# Patient Record
Sex: Male | Born: 1985 | Race: Black or African American | Hispanic: No | Marital: Single | Smoking: Current every day smoker
Health system: Southern US, Community
[De-identification: ages and names within clinical notes are randomized; demographics above are authoritative.]

---
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
## 2009-03-20 ENCOUNTER — Emergency Department (HOSPITAL_COMMUNITY): Admission: EM | Admit: 2009-03-20 | Discharge: 2009-03-21 | Payer: Self-pay | Admitting: Emergency Medicine

## 2009-03-26 ENCOUNTER — Emergency Department (HOSPITAL_COMMUNITY): Admission: EM | Admit: 2009-03-26 | Discharge: 2009-03-26 | Payer: Self-pay | Admitting: Emergency Medicine

## 2009-05-13 ENCOUNTER — Emergency Department (HOSPITAL_COMMUNITY): Admission: EM | Admit: 2009-05-13 | Discharge: 2009-05-13 | Payer: Self-pay | Admitting: Emergency Medicine

## 2010-07-15 ENCOUNTER — Emergency Department (HOSPITAL_COMMUNITY)
Admission: EM | Admit: 2010-07-15 | Discharge: 2010-07-15 | Disposition: A | Discharge status: Home / Self Care | Payer: Self-pay | Attending: Emergency Medicine | Admitting: Emergency Medicine

## 2010-07-15 DIAGNOSIS — H6690 Otitis media, unspecified, unspecified ear: Secondary | ICD-10-CM | POA: Insufficient documentation

## 2010-07-15 DIAGNOSIS — H9209 Otalgia, unspecified ear: Secondary | ICD-10-CM | POA: Insufficient documentation

## 2014-04-14 ENCOUNTER — Emergency Department (HOSPITAL_COMMUNITY)
Admission: EM | Admit: 2014-04-14 | Discharge: 2014-04-14 | Disposition: A | Discharge status: Home / Self Care | Payer: Self-pay | Attending: Emergency Medicine | Admitting: Emergency Medicine

## 2014-04-14 ENCOUNTER — Encounter (HOSPITAL_COMMUNITY): Payer: Self-pay | Admitting: Emergency Medicine

## 2014-04-14 DIAGNOSIS — Z23 Encounter for immunization: Secondary | ICD-10-CM | POA: Insufficient documentation

## 2014-04-14 DIAGNOSIS — Y9289 Other specified places as the place of occurrence of the external cause: Secondary | ICD-10-CM | POA: Insufficient documentation

## 2014-04-14 DIAGNOSIS — Y9389 Activity, other specified: Secondary | ICD-10-CM | POA: Insufficient documentation

## 2014-04-14 DIAGNOSIS — X58XXXA Exposure to other specified factors, initial encounter: Secondary | ICD-10-CM | POA: Insufficient documentation

## 2014-04-14 DIAGNOSIS — Y998 Other external cause status: Secondary | ICD-10-CM | POA: Insufficient documentation

## 2014-04-14 DIAGNOSIS — H1132 Conjunctival hemorrhage, left eye: Secondary | ICD-10-CM | POA: Insufficient documentation

## 2014-04-14 MED ORDER — TETANUS-DIPHTH-ACELL PERTUSSIS 5-2.5-18.5 LF-MCG/0.5 IM SUSP
0.5000 mL | Freq: Once | INTRAMUSCULAR | Status: AC
Start: 2014-04-14 — End: 2014-04-14
  Administered 2014-04-14: 0.5 mL via INTRAMUSCULAR
  Filled 2014-04-14: qty 0.5

## 2014-04-14 MED ORDER — FLUORESCEIN SODIUM 1 MG OP STRP
1.0000 | ORAL_STRIP | Freq: Once | OPHTHALMIC | Status: AC
  Administered 2014-04-14: 1 via OPHTHALMIC
  Filled 2014-04-14: qty 1

## 2014-04-14 MED ORDER — OXYCODONE-ACETAMINOPHEN 5-325 MG PO TABS
1.0000 | ORAL_TABLET | Freq: Once | ORAL | Status: AC
  Administered 2014-04-14: 1 via ORAL
  Filled 2014-04-14: qty 1

## 2014-04-14 MED ORDER — ERYTHROMYCIN 5 MG/GM OP OINT
TOPICAL_OINTMENT | Freq: Four times a day (QID) | OPHTHALMIC | Status: DC
  Administered 2014-04-14: 18:00:00 via OPHTHALMIC
  Filled 2014-04-14: qty 3.5

## 2014-04-14 MED ORDER — TETRACAINE HCL 0.5 % OP SOLN
2.0000 [drp] | Freq: Once | OPHTHALMIC | Status: AC
  Administered 2014-04-14: 2 [drp] via OPHTHALMIC
  Filled 2014-04-14: qty 2

## 2014-04-14 NOTE — ED Provider Notes (Signed)
CSN: [id number]     Arrival date & time 04/14/14  1440 History  This chart was scribed for non-physician practitioner, Joel Mow, PA-C working with Joel Sheffield, MD by Joel Boyd, ED scribe. This patient was seen in room Joel Boyd/Joel Boyd and the patient's care was started at 3:41 PM.    Chief Complaint  Patient presents with  . Eye Injury   The history is provided by the patient. No language interpreter was used.    HPI Comments: Joel Boyd is a 29 y.o. male who presents to the Emergency Department complaining of left eye injury that occurred prior to arrival. States his wife accidentally scratched his eye with her fingernail. Reports sudden onset pain with associated redness and irritation.  Patient denies eye pain, photophobia and blurred vision. His last tetanus was 5 years ago.   History reviewed. No pertinent past medical history. History reviewed. No pertinent past surgical history. History reviewed. No pertinent family history. History  Substance Use Topics  . Smoking status: Never Smoker   . Smokeless tobacco: Not on file  . Alcohol Use: No    Review of Systems  Eyes: Positive for photophobia, pain, redness and visual disturbance.  All other systems reviewed and are negative.  Allergies  Review of patient's allergies indicates no known allergies.  Home Medications   Prior to Admission medications   Not on File   BP 128/86 mmHg  Pulse 55  Temp(Src) 97.8 F (36.6 C) (Oral)  Resp 19  SpO2 100%   Physical Exam  Constitutional: He is oriented to person, place, and time. He appears well-developed and well-nourished. No distress.  HENT:  Head: Normocephalic and atraumatic.  Eyes: EOM and lids are normal. Pupils are equal, round, and reactive to light. Lids are everted and swept, no foreign bodies found. Right eye exhibits no chemosis and no discharge. Left eye exhibits no chemosis, no discharge and no exudate. No foreign body present in the left eye. Right  conjunctiva is not injected. Left conjunctiva is injected. No scleral icterus. Right eye exhibits normal extraocular motion and no nystagmus. Left eye exhibits normal extraocular motion and no nystagmus.  Slit lamp exam:      The left eye shows fluorescein uptake. The left eye shows no corneal abrasion, no corneal flare, no corneal ulcer, no foreign body, no hyphema, no hypopyon and no anterior chamber bulge.  Mild uptake noted in left eye medial to the cornea or iris consistent with a possible abrasion overlying the sclera. On regular exam, mild subconjunctival hemorrhage noted medial to iris in left eye.  Neck: Neck supple. No tracheal deviation present.  Cardiovascular: Normal rate.   Pulmonary/Chest: Effort normal. No respiratory distress.  Musculoskeletal: Normal range of motion.  Neurological: He is alert and oriented to person, place, and time.  Skin: Skin is warm and dry.  Psychiatric: He has a normal mood and affect. His behavior is normal.  Nursing note and vitals reviewed.   ED Course  Procedures (including critical care time)  DIAGNOSTIC STUDIES: Oxygen Saturation is 96% on RA, normal by my interpretation.    COORDINATION OF CARE: 3:44 PM-Discussed treatment plan which includes checking eye pressures and for corneal abrasion with pt at bedside and pt agreed to plan.   Labs Review Labs Reviewed - No data to display  Imaging Review No results found.   EKG Interpretation None      MDM   Final diagnoses:  Subconjunctival hemorrhage of left eye    Pt without corneal  abrasion on PE, however based on mechanism of being scratched in the eye, and mild fluorescein uptake noted in medial aspect of sclera, possibility of an abrasion medial to the cornea. Tdap given. Eye irrigated Boyd NS, no evidence of FB.  No change in vision, acuity 20/20 bilaterally.  Pt is not a contact lens wearer.  Exam non-concerning for orbital cellulitis, hyphema, corneal ulcers. Patient will be  discharged home with erythromycin.   Patient understands to follow up with ophthalmology, & to return to ER if new symptoms develop including change in vision, purulent drainage, or entrapment.  I personally performed the services described in this documentation, which was scribed in my presence. The recorded information has been reviewed and is accurate.  BP 128/86 mmHg  Pulse 55  Temp(Src) 97.8 F (36.6 C) (Oral)  Resp 19  SpO2 100%  Signed,  Joel MowJoe Betsy Rosello, PA-C 9:43 PM   Joel FantasiaJoseph Boyd [facility], PA-C 04/14/14 2142  Joel FantasiaJoseph Boyd Joel Matthes, PA-C 04/14/14 [id number]  Joel SheffieldForrest Harrison, MD 04/16/14 1057

## 2014-04-14 NOTE — Discharge Instructions (Signed)
Follow up with Opthalmology.  Return to the ER with any change in your vision, increase in pain, redness, swelling.  Use erythromycin ointment 1/2 inch to the conjunctival sac four times daily for 5 days.    Subconjunctival Hemorrhage A subconjunctival hemorrhage is a bright red patch covering a portion of the white of the eye. The white part of the eye is called the sclera, and it is covered by a thin membrane called the conjunctiva. This membrane is clear, except for tiny blood vessels that you can see with the naked eye. When your eye is irritated or inflamed and becomes red, it is because the vessels in the conjunctiva are swollen. Sometimes, a blood vessel in the conjunctiva can break and bleed. When this occurs, the blood builds up between the conjunctiva and the sclera, and spreads out to create a red area. The red spot may be very small at first. It may then spread to cover a larger part of the surface of the eye, or even all of the visible white part of the eye. In almost all cases, the blood will go away and the eye will become white again. Before completely dissolving, however, the red area may spread. It may also become brownish-yellow in color before going away. If a lot of blood collects under the conjunctiva, it may look like a bulge on the surface of the eye. This looks scary, but it will also eventually flatten out and go away. Subconjunctival hemorrhages do not cause pain, but if swollen, may cause a feeling of irritation. There is no effect on vision.  CAUSES   The most common cause is mild trauma (rubbing the eye, irritation).  Subconjunctival hemorrhages can happen because of coughing or straining (lifting heavy objects), vomiting, or sneezing.  In some cases, your doctor may want to check your blood pressure. High blood pressure can also cause a subconjunctival hemorrhage.  Severe trauma or blunt injuries.  Diseases that affect blood clotting (hemophilia,  leukemia).  Abnormalities of blood vessels behind the eye (carotid cavernous sinus fistula).  Tumors behind the eye.  Certain drugs (aspirin, Coumadin, heparin).  Recent eye surgery. HOME CARE INSTRUCTIONS   Do not worry about the appearance of your eye. You may continue your usual activities.  Often, follow-up is not necessary. SEEK MEDICAL CARE IF:   Your eye becomes painful.  The bleeding does not disappear within 3 weeks.  Bleeding occurs elsewhere, for example, under the skin, in the mouth, or in the other eye.  You have recurring subconjunctival hemorrhages. SEEK IMMEDIATE MEDICAL CARE IF:   Your vision changes or you have difficulty seeing.  You develop a severe headache, persistent vomiting, confusion, or abnormal drowsiness (lethargy).  Your eye seems to bulge or protrude from the eye socket.  You notice the sudden appearance of bruises or have spontaneous bleeding elsewhere on your body. Document Released: 03/20/2005 Document Revised: 08/04/2013 Document Reviewed: 02/15/2009 [facility] Patient Information 2015 [employer], [place]. This information is not intended to replace advice given to you by your health care provider. Make sure you discuss any questions you have with your health care provider.

## 2014-04-14 NOTE — ED Notes (Signed)
Pt's wife accidentally scratched pt's L eye with fingernail 15 minutes ago. Sclera reddened, pupil 2mm. Pt complains of blurred vision in L eye. Last tetanus shot 5 years ago. No bleeding

## 2017-07-30 ENCOUNTER — Emergency Department (HOSPITAL_COMMUNITY)
Admission: EM | Admit: 2017-07-30 | Discharge: 2017-07-30 | Disposition: A | Discharge status: Home / Self Care | Payer: Self-pay | Attending: Emergency Medicine | Admitting: Emergency Medicine

## 2017-07-30 ENCOUNTER — Emergency Department (HOSPITAL_COMMUNITY): Payer: Self-pay

## 2017-07-30 ENCOUNTER — Encounter (HOSPITAL_COMMUNITY): Payer: Self-pay

## 2017-07-30 DIAGNOSIS — Y939 Activity, unspecified: Secondary | ICD-10-CM | POA: Insufficient documentation

## 2017-07-30 DIAGNOSIS — Y999 Unspecified external cause status: Secondary | ICD-10-CM | POA: Insufficient documentation

## 2017-07-30 DIAGNOSIS — S29012A Strain of muscle and tendon of back wall of thorax, initial encounter: Secondary | ICD-10-CM | POA: Insufficient documentation

## 2017-07-30 DIAGNOSIS — X509XXA Other and unspecified overexertion or strenuous movements or postures, initial encounter: Secondary | ICD-10-CM | POA: Insufficient documentation

## 2017-07-30 DIAGNOSIS — Y929 Unspecified place or not applicable: Secondary | ICD-10-CM | POA: Insufficient documentation

## 2017-07-30 LAB — D-DIMER, QUANTITATIVE: D-Dimer, Quant: 0.43 ug/mL-FEU (ref 0.00–0.50)

## 2017-07-30 MED ORDER — CYCLOBENZAPRINE HCL 10 MG PO TABS: 10.0000 mg | ORAL_TABLET | Freq: Two times a day (BID) | ORAL | 0 refills | Status: AC | PRN

## 2017-07-30 NOTE — ED Provider Notes (Signed)
Patient placed in Quick Look pathway, seen and evaluated   Chief Complaint: Pain in left posterior chest  HPI:   He reports that Saturday he was lifting a heavy person and since then he has had pressure and pain in his left sided mid back.  He has tried heat without relief.  He was offered ibuprofen and Tylenol which he refused.  ROS: No fevers or cough.  (one)  Physical Exam:   Gen: No distress  Neuro: Awake and Alert  Skin: Warm    Focused Exam: Patient's pain is re-created with palpation over the left mid back medial to the scapula.  There is what appears to be a tight muscle in this area.  Palpation in this area exacerbates his reported pain.   Initiation of care has begun. The patient has been counseled on the process, plan, and necessity for staying for the completion/evaluation, and the remainder of the medical screening examination    Norman Clay 07/30/17 1334    Lorre Nick, MD 07/31/17 1313

## 2017-07-30 NOTE — ED Triage Notes (Signed)
Pt presents for evaluation of pressure and pain to L shoulder and scapula area since Saturday. Denies other medical hx or injury.

## 2017-07-30 NOTE — Discharge Instructions (Addendum)
Your chest x-ray was reassuring.  D-dimer was normal.  Please take Flexeril at home.  As we discussed this medicine can make you drowsy so please do not drive or work while taking it.  You can also take ibuprofen 600 mg every 6 hours for pain.  Continue to apply heat to the back.  Schedule appointment with your regular doctor in a week if your symptoms are not improving.  Return to the ER if you have any new or concerning symptoms like trouble breathing, chest pain.

## 2017-07-30 NOTE — ED Provider Notes (Signed)
[facility] EMERGENCY DEPARTMENT Provider Note   CSN: [id number] Arrival date & time: 07/30/17  1221     History   Chief Complaint Chief Complaint  Patient presents with  . Shoulder Pain  . Back Pain    HPI Joel Boyd is a 32 y.o. male.  HPI  Joel Boyd is a 32 year old male with no significant past medical history who presents to the emergency department for evaluation of left upper back pain.  Patient reports that pain began out of nowhere 2 days ago.  Denies injury or strenuous activity he is aware of.  Reports that pain feels like "pressure" over the left upper back.  He denies radiation of the pain to his shoulder.  He states that pain occurs when he takes a deep breath or when he moves the neck or arms in any way. He has been placing heat over the back which has given him some improvement.  He has otherwise not taken any over-the-counter medications for his symptoms.  He denies headache, chest pain, shortness of breath, numbness, weakness, abdominal pain, nausea/vomiting.  He denies history of DVT/PE, leg swelling or calf pain, recent surgery or immobilization, exogenous testosterone.  History reviewed. No pertinent past medical history.  There are no active problems to display for this patient.   History reviewed. No pertinent surgical history.      Home Medications    Prior to Admission medications   Not on File    Family History No family history on file.  Social History Social History   Tobacco Use  . Smoking status: Never Smoker  Substance Use Topics  . Alcohol use: No  . Drug use: No     Allergies   Patient has no known allergies.   Review of Systems Review of Systems  Constitutional: Negative for chills and fever.  Respiratory: Negative for cough and shortness of breath.   Cardiovascular: Negative for chest pain and leg swelling.  Gastrointestinal: Negative for abdominal pain, nausea and vomiting.  Musculoskeletal:  Positive for back pain (left upper). Negative for arthralgias, neck pain and neck stiffness.  Skin: Negative for rash.  Neurological: Negative for syncope, light-headedness and headaches.     Physical Exam Updated Vital Signs BP (!) 126/108 (BP Location: Right Arm)   Pulse (!) 49   Temp 99.5 F (37.5 C) (Oral)   Resp 12   Ht  (1.803 m)   Wt 70.3 kg (155 lb)   SpO2 100%   BMI 21.62 kg/m   Physical Exam  Constitutional: He is oriented to person, place, and time. He appears well-developed and well-nourished. No distress.  Sitting at bedside in no apparent distress, nontoxic-appearing.  HENT:  Head: Normocephalic and atraumatic.  Mouth/Throat: Oropharynx is clear and moist. No oropharyngeal exudate.  Eyes: Pupils are equal, round, and reactive to light. Conjunctivae are normal. Right eye exhibits no discharge. Left eye exhibits no discharge.  Neck: Normal range of motion. Neck supple.  Full neck range of motion.  Patient reports tenderness in left upper back particularly with left lateral neck flexion.  Cardiovascular: Normal rate, regular rhythm and intact distal pulses. Exam reveals no friction rub.  No murmur heard. Pulmonary/Chest: Effort normal and breath sounds normal. No stridor. No respiratory distress. He has no wheezes. He has no rales.  Musculoskeletal:  No midline tenderness over the thoracic spine or paraspinal muscles where patient is describing pain.  No rash, ecchymosis or break in skin.  Grip strength 5/5 in  bilateral upper extremities.  Radial pulses 2+ bilaterally.  Light touch intact in bilateral upper extremities.  Neurological: He is alert and oriented to person, place, and time. Coordination normal.  Skin: Skin is warm and dry. He is not diaphoretic.  Psychiatric: He has a normal mood and affect. His behavior is normal.  Nursing note and vitals reviewed.    ED Treatments / Results  Labs (all labs ordered are listed, but only abnormal results are  displayed) Labs Reviewed  D-DIMER, QUANTITATIVE (NOT AT [facility])    EKG None  Radiology Dg Chest 2 View  Result Date: 07/30/2017 CLINICAL DATA:  Pt presents for evaluation of pressure and pain to L shoulder and scapula area since Saturday. Denies other medical hx or injury. EXAM: CHEST - 2 VIEW COMPARISON:  none FINDINGS: Lungs are clear. Heart size and mediastinal contours are within normal limits. No effusion.  No pneumothorax. Visualized bones unremarkable. IMPRESSION: No acute cardiopulmonary disease. Electronically Signed   By: Corlis Leak M.D.   On: 07/30/2017 13:56    Procedures Procedures (including critical care time)  Medications Ordered in ED Medications - No data to display   Initial Impression / Assessment and Plan / ED Course  I have reviewed the triage vital signs and the nursing notes.  Pertinent labs & imaging results that were available during my care of the patient were reviewed by me and considered in my medical decision making (see chart for details).     Otherwise healthy 32 year old male presents to the emergency department for evaluation of left upper back pain.  No history of injury or strenuous lifting.  Chest x-ray without acute abnormality.  Given he reports pain is worsened with movement, suspect musculoskeletal strain.    His pain is somewhat atypical given pain is not reproducible on exam. He is also reporting pleuritic nature to pain. Low suspicion for PE, but given he reports that pain is pleuritic, will get d-dimer to further evaluate.   Ddimer negative. Plan to treat patient's symptoms for MSK strain with muscle relaxer. Counseled him that this medication can make him drowsy and he should not drive or work while taking it. Discussed return precautions and he agrees.  Final Clinical Impressions(s) / ED Diagnoses   Final diagnoses:  Strain of thoracic back region    ED Discharge Orders        Ordered    cyclobenzaprine (FLEXERIL) 10 MG tablet  2  times daily PRN     07/30/17 1718       Kellie Shropshire, PA-C 08/01/17 4098    Tegeler, Canary Brim, MD 08/01/17 1104

## 2018-12-01 ENCOUNTER — Emergency Department (HOSPITAL_COMMUNITY)
Admission: EM | Admit: 2018-12-01 | Discharge: 2018-12-02 | Disposition: A | Discharge status: Home / Self Care | Payer: Self-pay | Attending: Emergency Medicine | Admitting: Emergency Medicine

## 2018-12-01 ENCOUNTER — Encounter (HOSPITAL_COMMUNITY): Payer: Self-pay | Admitting: Emergency Medicine

## 2018-12-01 ENCOUNTER — Other Ambulatory Visit: Payer: Self-pay

## 2018-12-01 DIAGNOSIS — Z20828 Contact with and (suspected) exposure to other viral communicable diseases: Secondary | ICD-10-CM | POA: Insufficient documentation

## 2018-12-01 DIAGNOSIS — F17200 Nicotine dependence, unspecified, uncomplicated: Secondary | ICD-10-CM | POA: Insufficient documentation

## 2018-12-01 DIAGNOSIS — J189 Pneumonia, unspecified organism: Secondary | ICD-10-CM

## 2018-12-01 DIAGNOSIS — J181 Lobar pneumonia, unspecified organism: Secondary | ICD-10-CM | POA: Insufficient documentation

## 2018-12-01 DIAGNOSIS — R109 Unspecified abdominal pain: Secondary | ICD-10-CM | POA: Insufficient documentation

## 2018-12-01 DIAGNOSIS — M545 Low back pain, unspecified: Secondary | ICD-10-CM

## 2018-12-01 LAB — CBC
HCT: 40.7 % (ref 39.0–52.0)
Hemoglobin: 13.3 g/dL (ref 13.0–17.0)
MCH: 24.6 pg — ABNORMAL LOW (ref 26.0–34.0)
MCHC: 32.7 g/dL (ref 30.0–36.0)
MCV: 75.4 fL — ABNORMAL LOW (ref 80.0–100.0)
Platelets: 158 10*3/uL (ref 150–400)
RBC: 5.4 MIL/uL (ref 4.22–5.81)
RDW: 14.2 % (ref 11.5–15.5)
WBC: 11.5 10*3/uL — ABNORMAL HIGH (ref 4.0–10.5)
nRBC: 0 % (ref 0.0–0.2)

## 2018-12-01 LAB — LIPASE, BLOOD: Lipase: 25 U/L (ref 11–51)

## 2018-12-01 LAB — COMPREHENSIVE METABOLIC PANEL
ALT: 12 U/L (ref 0–44)
AST: 17 U/L (ref 15–41)
Albumin: 4.2 g/dL (ref 3.5–5.0)
Alkaline Phosphatase: 42 U/L (ref 38–126)
Anion gap: 12 (ref 5–15)
BUN: 10 mg/dL (ref 6–20)
CO2: 20 mmol/L — ABNORMAL LOW (ref 22–32)
Calcium: 9.2 mg/dL (ref 8.9–10.3)
Chloride: 99 mmol/L (ref 98–111)
Creatinine, Ser: 1.03 mg/dL (ref 0.61–1.24)
GFR calc Af Amer: >60 mL/min (ref >60)
GFR calc non Af Amer: >60 mL/min (ref >60)
Glucose, Bld: 133 mg/dL — ABNORMAL HIGH (ref 70–99)
Potassium: 3.3 mmol/L — ABNORMAL LOW (ref 3.5–5.1)
Sodium: 131 mmol/L — ABNORMAL LOW (ref 135–145)
Total Bilirubin: 0.6 mg/dL (ref 0.3–1.2)
Total Protein: 7.1 g/dL (ref 6.5–8.1)

## 2018-12-01 MED ORDER — SODIUM CHLORIDE 0.9% FLUSH: 3.0000 mL | Freq: Once | INTRAVENOUS | Status: DC

## 2018-12-01 NOTE — ED Triage Notes (Signed)
Pt c/o L flank pain onset 2 days ago, intermittent, severe at times. Radiation around upper abd at times. Denies n/v/d, denies urinary s/s.

## 2018-12-02 ENCOUNTER — Emergency Department (HOSPITAL_COMMUNITY): Payer: Self-pay

## 2018-12-02 LAB — URINALYSIS, ROUTINE W REFLEX MICROSCOPIC
Bacteria, UA: NONE SEEN
Bilirubin Urine: NEGATIVE
Glucose, UA: NEGATIVE mg/dL
Ketones, ur: NEGATIVE mg/dL
Nitrite: NEGATIVE
Protein, ur: NEGATIVE mg/dL
Specific Gravity, Urine: 1.006 (ref 1.005–1.030)
pH: 6 (ref 5.0–8.0)

## 2018-12-02 LAB — LACTIC ACID, PLASMA: Lactic Acid, Venous: 1.1 mmol/L (ref 0.5–1.9)

## 2018-12-02 LAB — SARS CORONAVIRUS 2 BY RT PCR (HOSPITAL ORDER, PERFORMED IN ~~LOC~~ HOSPITAL LAB): SARS Coronavirus 2: NEGATIVE

## 2018-12-02 MED ORDER — AMOXICILLIN-POT CLAVULANATE 875-125 MG PO TABS: 1.0000 | ORAL_TABLET | Freq: Two times a day (BID) | ORAL | 0 refills | Status: AC

## 2018-12-02 MED ORDER — DOXYCYCLINE HYCLATE 100 MG PO CAPS: 100.0000 mg | ORAL_CAPSULE | Freq: Two times a day (BID) | ORAL | 0 refills | Status: AC

## 2018-12-02 MED ORDER — AMOXICILLIN-POT CLAVULANATE 875-125 MG PO TABS
1.0000 | ORAL_TABLET | Freq: Once | ORAL | Status: AC
  Administered 2018-12-02: 1 via ORAL
  Filled 2018-12-02: qty 1

## 2018-12-02 MED ORDER — DOXYCYCLINE HYCLATE 100 MG PO TABS
100.0000 mg | ORAL_TABLET | Freq: Once | ORAL | Status: AC
  Administered 2018-12-02: 100 mg via ORAL
  Filled 2018-12-02: qty 1

## 2018-12-02 NOTE — ED Provider Notes (Signed)
[facility] Provider Note   CSN: [id number] Arrival date & time: 12/01/18  2053     History   Chief Complaint Chief Complaint  Patient presents with   Flank Pain    HPI Joel Boyd is a 33 y.o. male.     Patient reports intermittent left-sided low back pain for the past 2 days.  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  The pain is been intermittent coming and going for the past 2 days and does go away completely at times.  Has been taking ibuprofen and Tylenol with partial relief.  There is no radiation of the pain down his back or leg.  No abdominal pain.  No pain with urination or blood in the urine.  No nausea, vomiting, diarrhea.  No fever but did have temperature 100.5 on arrival.  No testicular pain.  No history of cancer or IV drug abuse.  No history of kidney stones.  No history of back problems. No bowel or bladder incontinence.  No weakness in his legs.  No numbness or tingling.  No abdominal pain.  The history is provided by the patient.  Flank Pain Pertinent negatives include no abdominal pain, no headaches and no shortness of breath.    History reviewed. No pertinent past medical history.  There are no active problems to display for this patient.   History reviewed. No pertinent surgical history.      Home Medications    Prior to Admission medications   Medication Sig Start Date End Date Taking? Authorizing Provider  cyclobenzaprine (FLEXERIL) 10 MG tablet Take 1 tablet (10 mg total) by mouth 2 (two) times daily as needed for muscle spasms. 07/30/17   Kellie ShropshireShrosbree, Emily J, PA-C    Family History No family history on file.  Social History Social History   Tobacco Use   Smoking status: Current Every Day Smoker   Smokeless tobacco: Never Used  Substance Use Topics   Alcohol use: Yes   Drug use: No     Allergies   Patient has no known  allergies.   Review of Systems Review of Systems  Constitutional: Negative for activity change, appetite change and fever.  HENT: Negative for congestion.   Respiratory: Negative for cough, chest tightness and shortness of breath.   Gastrointestinal: Negative for abdominal pain, nausea and vomiting.  Genitourinary: Positive for flank pain. Negative for dysuria, hematuria and urgency.  Musculoskeletal: Positive for back pain. Negative for arthralgias and myalgias.  Neurological: Negative for dizziness, weakness, light-headedness and headaches.   all other systems are negative except as noted in the HPI and PMH.     Physical Exam Updated Vital Signs BP 118/77 (BP Location: Right Arm)    Pulse 81    Temp (!) 100.5 F (38.1 C) (Oral)    Resp 17    Ht 5\' 11"  (1.803 m)    Wt 70 kg    SpO2 96%    BMI 21.52 kg/m   Physical Exam Vitals signs and nursing note reviewed.  Constitutional:      General: He is not in acute distress.    Appearance: He is well-developed.  HENT:     Head: Normocephalic and atraumatic.     Mouth/Throat:     Pharynx: No oropharyngeal exudate.  Eyes:     Conjunctiva/sclera: Conjunctivae normal.     Pupils: Pupils are equal, round, and reactive to light.  Neck:  Musculoskeletal: Normal range of motion and neck supple.     Comments: No meningismus. Cardiovascular:     Rate and Rhythm: Normal rate and regular rhythm.     Heart sounds: Normal heart sounds. No murmur.  Pulmonary:     Effort: Pulmonary effort is normal. No respiratory distress.     Breath sounds: Normal breath sounds.  Abdominal:     Palpations: Abdomen is soft.     Tenderness: There is no abdominal tenderness. There is no guarding or rebound.  Musculoskeletal: Normal range of motion.        General: Tenderness present.     Comments: Left paraspinal lumbar tenderness, no midline tenderness  5/5 strength in bilateral lower extremities. Ankle plantar and dorsiflexion intact. Great toe  extension intact bilaterally. +2 DP and PT pulses. +2 patellar reflexes bilaterally. Normal gait.   Skin:    General: Skin is warm.     Capillary Refill: Capillary refill takes less than 2 seconds.  Neurological:     General: No focal deficit present.     Mental Status: He is alert and oriented to person, place, and time. Mental status is at baseline.     Cranial Nerves: No cranial nerve deficit.     Motor: No abnormal muscle tone.     Coordination: Coordination normal.     Comments: No ataxia on finger to nose bilaterally. No pronator drift. 5/5 strength throughout. CN 2-12 intact.Equal grip strength. Sensation intact.   Psychiatric:        Behavior: Behavior normal.      ED Treatments / Results  Labs (all labs ordered are listed, but only abnormal results are displayed) Labs Reviewed  COMPREHENSIVE METABOLIC PANEL - Abnormal; Notable for the following components:      Result Value   Sodium 131 (*)    Potassium 3.3 (*)    CO2 20 (*)    Glucose, Bld 133 (*)    All other components within normal limits  CBC - Abnormal; Notable for the following components:   WBC 11.5 (*)    MCV 75.4 (*)    MCH 24.6 (*)    All other components within normal limits  URINALYSIS, ROUTINE W REFLEX MICROSCOPIC - Abnormal; Notable for the following components:   Hgb urine dipstick SMALL (*)    Leukocytes,Ua SMALL (*)    All other components within normal limits  SARS CORONAVIRUS 2 (HOSPITAL ORDER, PERFORMED IN [facility])  CULTURE, BLOOD (ROUTINE X 2)  CULTURE, BLOOD (ROUTINE X 2)  URINE CULTURE  LIPASE, BLOOD  LACTIC ACID, PLASMA    EKG None  Radiology Dg Chest 2 View  Result Date: 12/02/2018 CLINICAL DATA:  Initial evaluation for acute left flank pain, question pneumonia. EXAM: CHEST - 2 VIEW COMPARISON:  Prior CT from earlier same day as well as prior radiograph from 07/30/17. FINDINGS: Cardiac and mediastinal silhouettes are stable in size and contour, and remain within  normal limits. Previously identified consolidative opacity involving the retrocardiac posterior left lower lobe again seen, best visualized on lateral projection. No other focal airspace disease. No edema or effusion. No pneumothorax. No acute osseous abnormality. IMPRESSION: Consolidative opacity involving the retrocardiac left lower lobe, suspicious for pneumonia, and corresponding with consolidation seen on recent CT. Electronically Signed   By: Rise MuBenjamin  McClintock M.D.   On: 12/02/2018 01:10   Ct Renal Stone Study  Result Date: 12/02/2018 CLINICAL DATA:  Initial evaluation for acute left flank pain. EXAM: CT ABDOMEN AND PELVIS WITHOUT  CONTRAST TECHNIQUE: Multidetector CT imaging of the abdomen and pelvis was performed following the standard protocol without IV contrast. COMPARISON:  None. FINDINGS: Lower chest: Consolidative opacity present within the posterior 0 medial left lower lobe, consistent with pneumonia. Visualized lungs are otherwise clear. Hepatobiliary: Limited noncontrast evaluation of the liver is unremarkable. Gallbladder within normal limits. No biliary dilatation. Pancreas: Pancreas within normal limits. Spleen: Spleen within normal limits. Adrenals/Urinary Tract: Adrenal glands are normal. Kidneys equal in size without nephrolithiasis or hydronephrosis. No radiopaque calculi seen along the course of either renal collecting system. No visible hydroureter. Partially distended bladder within normal limits. No layering stones within the bladder lumen. Stomach/Bowel: Stomach largely decompressed without acute finding. No evidence for bowel obstruction. No acute inflammatory changes seen about the bowels. No findings to suggest acute appendicitis. Vascular/Lymphatic: Mild atherosclerotic plaque within the intra-abdominal aorta. No visible aneurysm. No adenopathy. Reproductive: Prostate within normal limits. Other: No free air or fluid. Musculoskeletal: No acute osseous finding. No discrete lytic  or blastic osseous lesions. IMPRESSION: 1. Left lower lobe consolidation, consistent with pneumonia. 2. No CT evidence for nephrolithiasis or obstructive uropathy. 3. No other acute intra-abdominal or pelvic process identified. Electronically Signed   By: Rise Mu M.D.   On: 12/02/2018 00:34    Procedures Procedures (including critical care time)  Medications Ordered in ED Medications  sodium chloride flush (NS) 0.9 % injection 3 mL (has no administration in time range)     Initial Impression / Assessment and Plan / ED Course  I have reviewed the triage vital signs and the nursing notes.  Pertinent labs & imaging results that were available during my care of the patient were reviewed by me and considered in my medical decision making (see chart for details).       Left paraspinal back pain for the past 2 days, no midline tenderness, no weakness, numbness or tingling.  Equal distal sensation, pulses and reflexes  Low suspicion for cord compression or cauda equina. UA shows trace hematuria.  No ureteral stone seen on CT scan.  However there is left lower lobe consolidation consistent with pneumonia.  Patient does state he is had a mild cough.  Was found to be febrile on arrival.  He is in no distress and not hypoxic.  He is able to ambulate without desaturation.  His coronavirus test was sent and pending at discharge.  He is given quarantine precautions. No hypoxia or tachycardia. PERC negative.  Patient able to ambulate without desaturation.  He is in no distress.  He is given antibiotics for pneumonia.  Return precautions discussed including difficulty breathing, persistent fever, chest pain, any other concerns.   Joel Boyd was evaluated in Emergency Department on 12/02/2018 for the symptoms described in the history of present illness. He was evaluated in the context of the global COVID-19 pandemic, which necessitated consideration that the patient might be at risk for  infection with the SARS-CoV-2 virus that causes COVID-19. Institutional protocols and algorithms that pertain to the evaluation of patients at risk for COVID-19 are in a state of rapid change based on information released by regulatory bodies including the CDC and federal and state organizations. These policies and algorithms were followed during the patient's care in the ED.  BP (!) 126/91    Pulse 61    Temp 98.2 F (36.8 C) (Oral)    Resp 16    Ht 5\' 11"  (1.803 m)    Wt 70 kg    SpO2 100%  BMI 21.52 kg/m    Final Clinical Impressions(s) / ED Diagnoses   Final diagnoses:  Acute left-sided low back pain without sciatica  Community acquired pneumonia of left lower lobe of lung [facility])    ED Discharge Orders    None       Yuritzi Kamp, Annie Main, MD 12/02/18 [phone]

## 2018-12-02 NOTE — Discharge Instructions (Signed)
Your testing is negative for kidney stone but does show pneumonia.  Take the antibiotics as prescribed.  Establish care with a primary doctor.  Return to the ED with chest pain, shortness of breath, persistent fevers, any other concerns.

## 2018-12-02 NOTE — ED Notes (Signed)
Patient verbalizes understanding of discharge instructions. Opportunity for questioning and answers were provided. Armband removed by staff, pt discharged from ED.  

## 2018-12-03 LAB — URINE CULTURE: Culture: NO GROWTH

## 2018-12-07 LAB — CULTURE, BLOOD (ROUTINE X 2)
Culture: NO GROWTH
Culture: NO GROWTH
Special Requests: ADEQUATE

## 2020-03-24 IMAGING — CT CT RENAL STONE PROTOCOL
2 of 4 series · 16 of 46 positions shown, 18 images · non-contrast
Comparison: None.

CLINICAL DATA: Initial evaluation for acute left flank pain.

EXAM:
CT ABDOMEN AND PELVIS WITHOUT CONTRAST
TECHNIQUE: Multidetector CT imaging of the abdomen and pelvis was performed
following the standard protocol without IV contrast.

[Series 3: renal stone 5.0 · axial · 0.74mm/px · z∈[+865,+1225]mm · 13 of 80 slices shown, 15 images]
[im 4/80  soft-tissue]
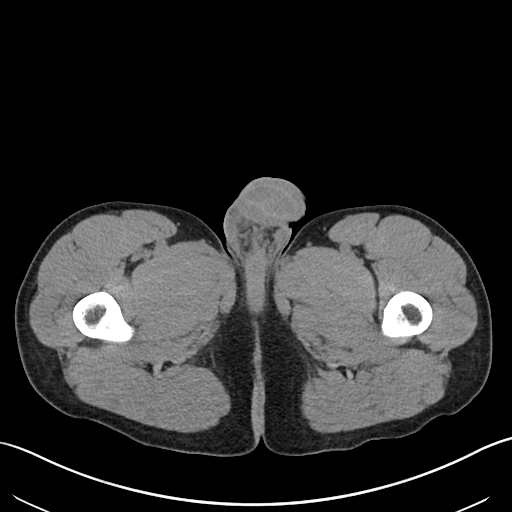
[im 4/80  bone]
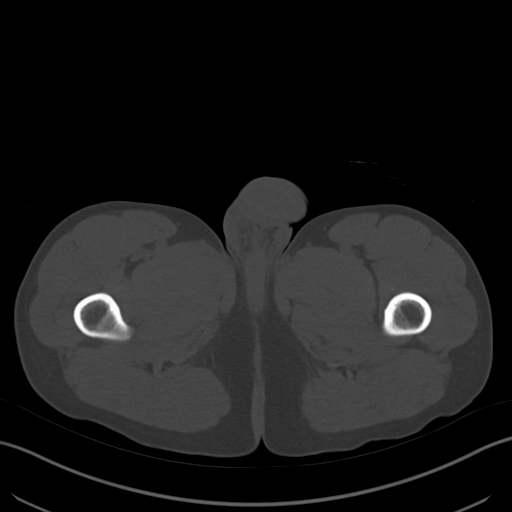
[im 10/80  soft-tissue]
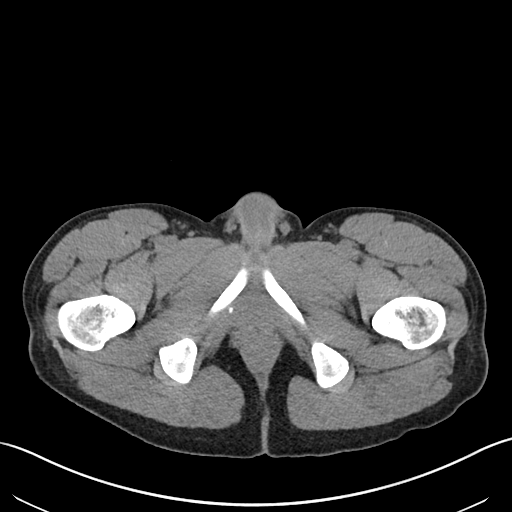
[im 16/80  soft-tissue]
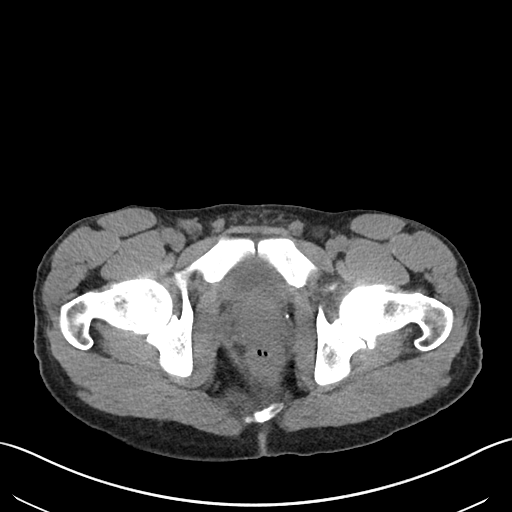
[im 23/80  soft-tissue]
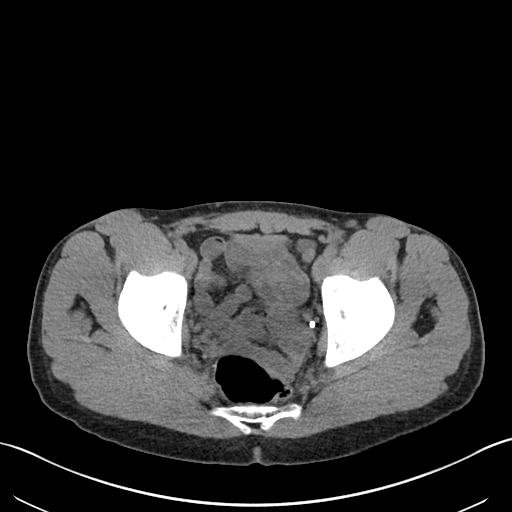
[im 29/80  soft-tissue]
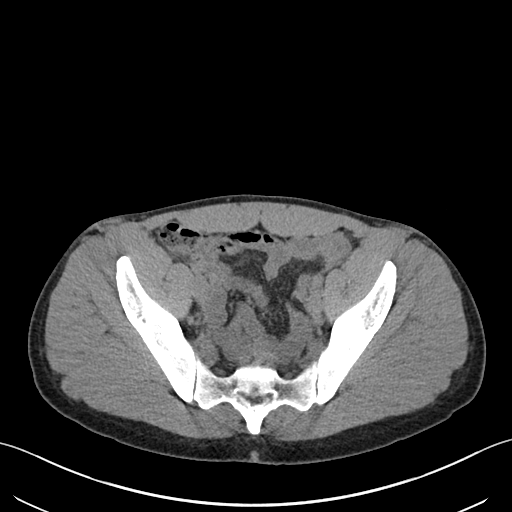
[im 35/80  soft-tissue]
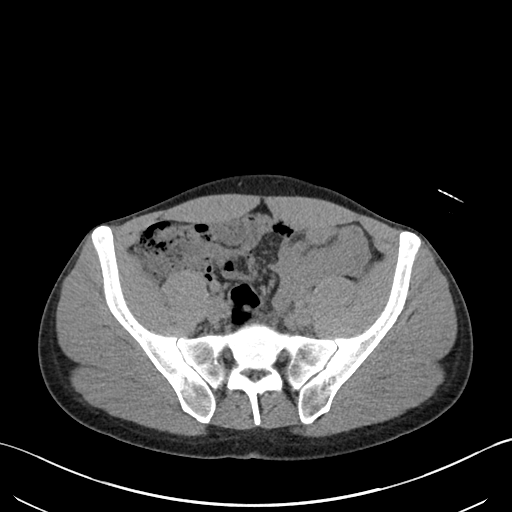
[im 42/80  soft-tissue]
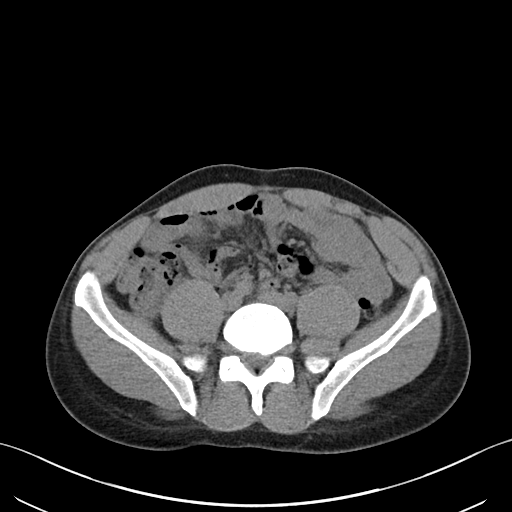
[im 45/80  soft-tissue]
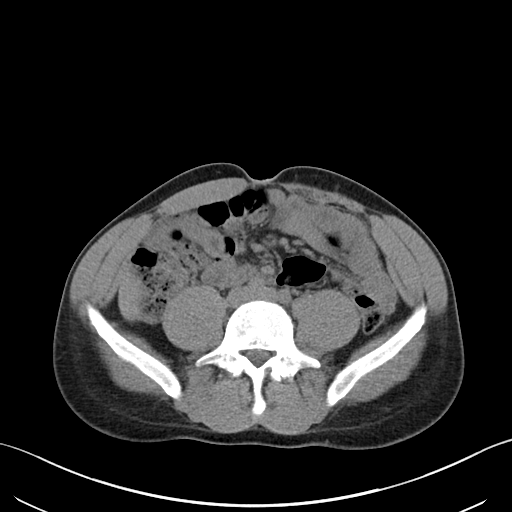
[im 51/80  soft-tissue]
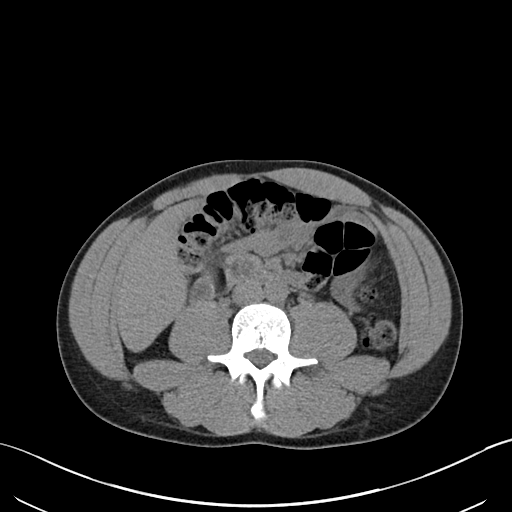
[im 51/80  bone]
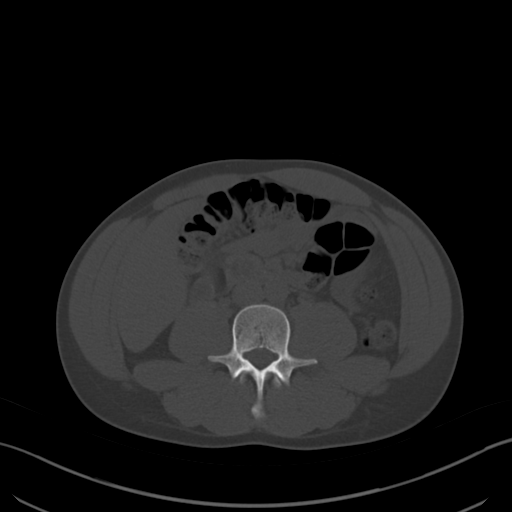
[im 57/80  soft-tissue]
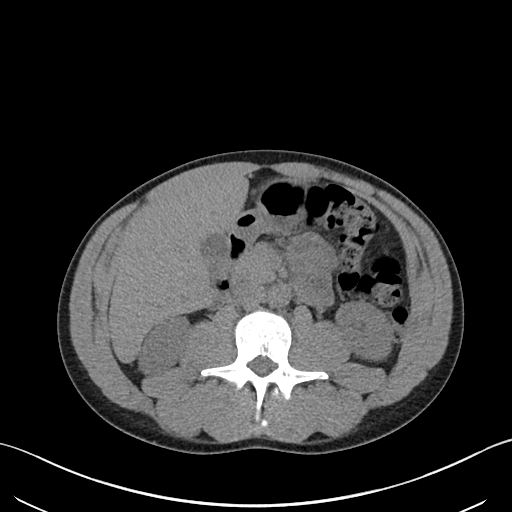
[im 64/80  soft-tissue]
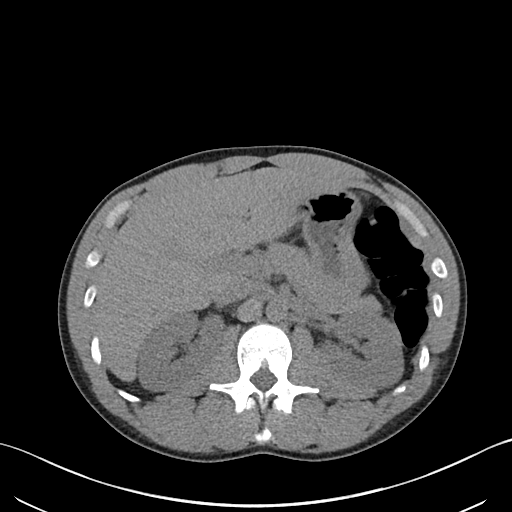
[im 70/80  soft-tissue]
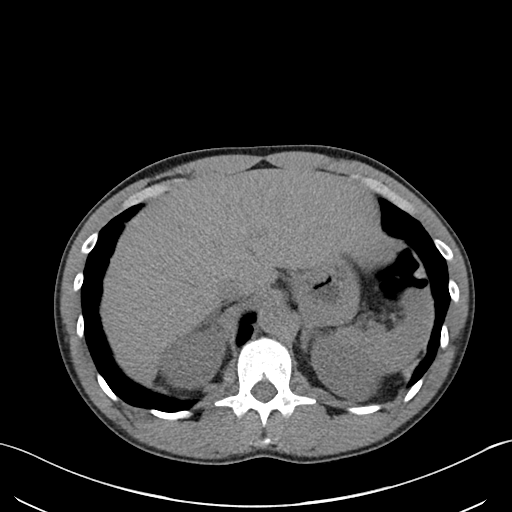
[im 76/80  soft-tissue]
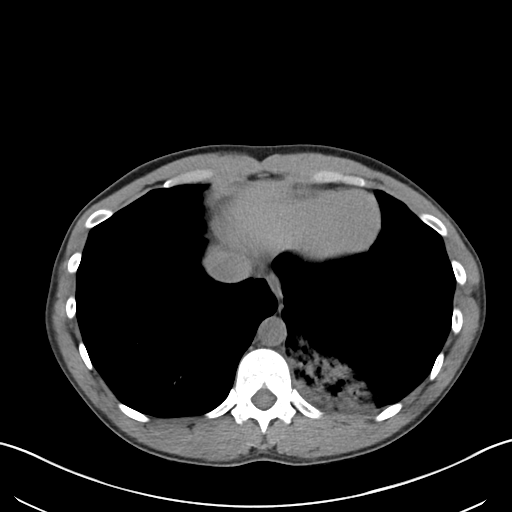

[Series 6: renal stone 3.0 cor · coronal · 0.70mm/px · 3 of 86 slices shown]
[im 29/86  soft-tissue]
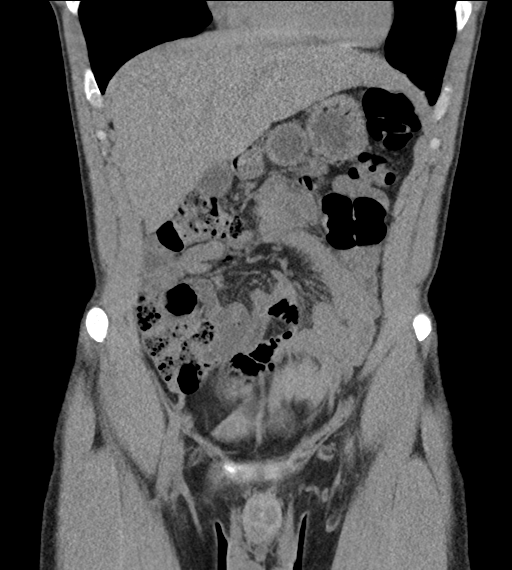
[im 38/86  soft-tissue]
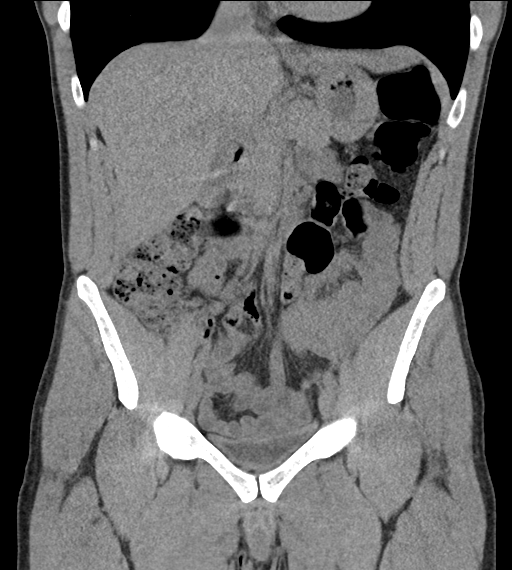
[im 48/86  soft-tissue]
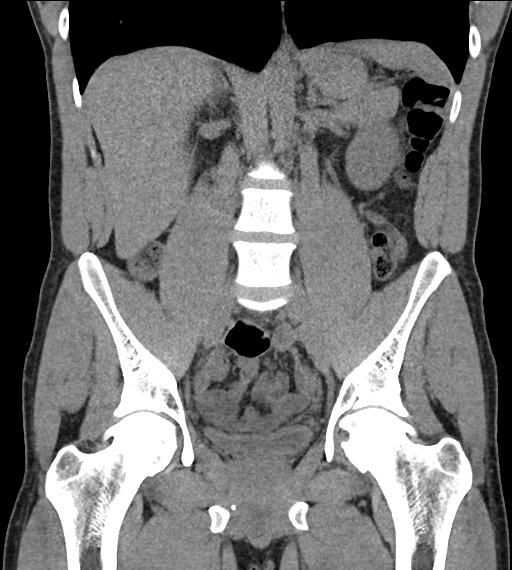

[16 of 46 positions shown; findings below may reference images not displayed]

FINDINGS: Lower chest: Consolidative opacity present within the posterior 0
medial left lower lobe, consistent with pneumonia. Visualized lungs
are otherwise clear.

Hepatobiliary: Limited noncontrast evaluation of the liver is
unremarkable. Gallbladder within normal limits. No biliary
dilatation.

Pancreas: Pancreas within normal limits.

Spleen: Spleen within normal limits.

Adrenals/Urinary Tract: Adrenal glands are normal. Kidneys equal in
size without nephrolithiasis or hydronephrosis. No radiopaque
calculi seen along the course of either renal collecting system. No
visible hydroureter. Partially distended bladder within normal
limits. No layering stones within the bladder lumen.

Stomach/Bowel: Stomach largely decompressed without acute finding.
No evidence for bowel obstruction. No acute inflammatory changes
seen about the bowels. No findings to suggest acute appendicitis.

Vascular/Lymphatic: Mild atherosclerotic plaque within the
intra-abdominal aorta. No visible aneurysm. No adenopathy.

Reproductive: Prostate within normal limits.

Other: No free air or fluid.

Musculoskeletal: No acute osseous finding. No discrete lytic or
blastic osseous lesions.
IMPRESSION: 1. Left lower lobe consolidation, consistent with pneumonia.
2. No CT evidence for nephrolithiasis or obstructive uropathy.
3. No other acute intra-abdominal or pelvic process identified.

## 2020-03-24 IMAGING — CR CHEST - 2 VIEW
2 series · 2 of 2 positions shown · non-contrast
Comparison: Prior CT from earlier same day as well as prior
radiograph from 07/30/17.

CLINICAL DATA: Initial evaluation for acute left flank pain,
question pneumonia.

EXAM:
CHEST - 2 VIEW

[chest pa]
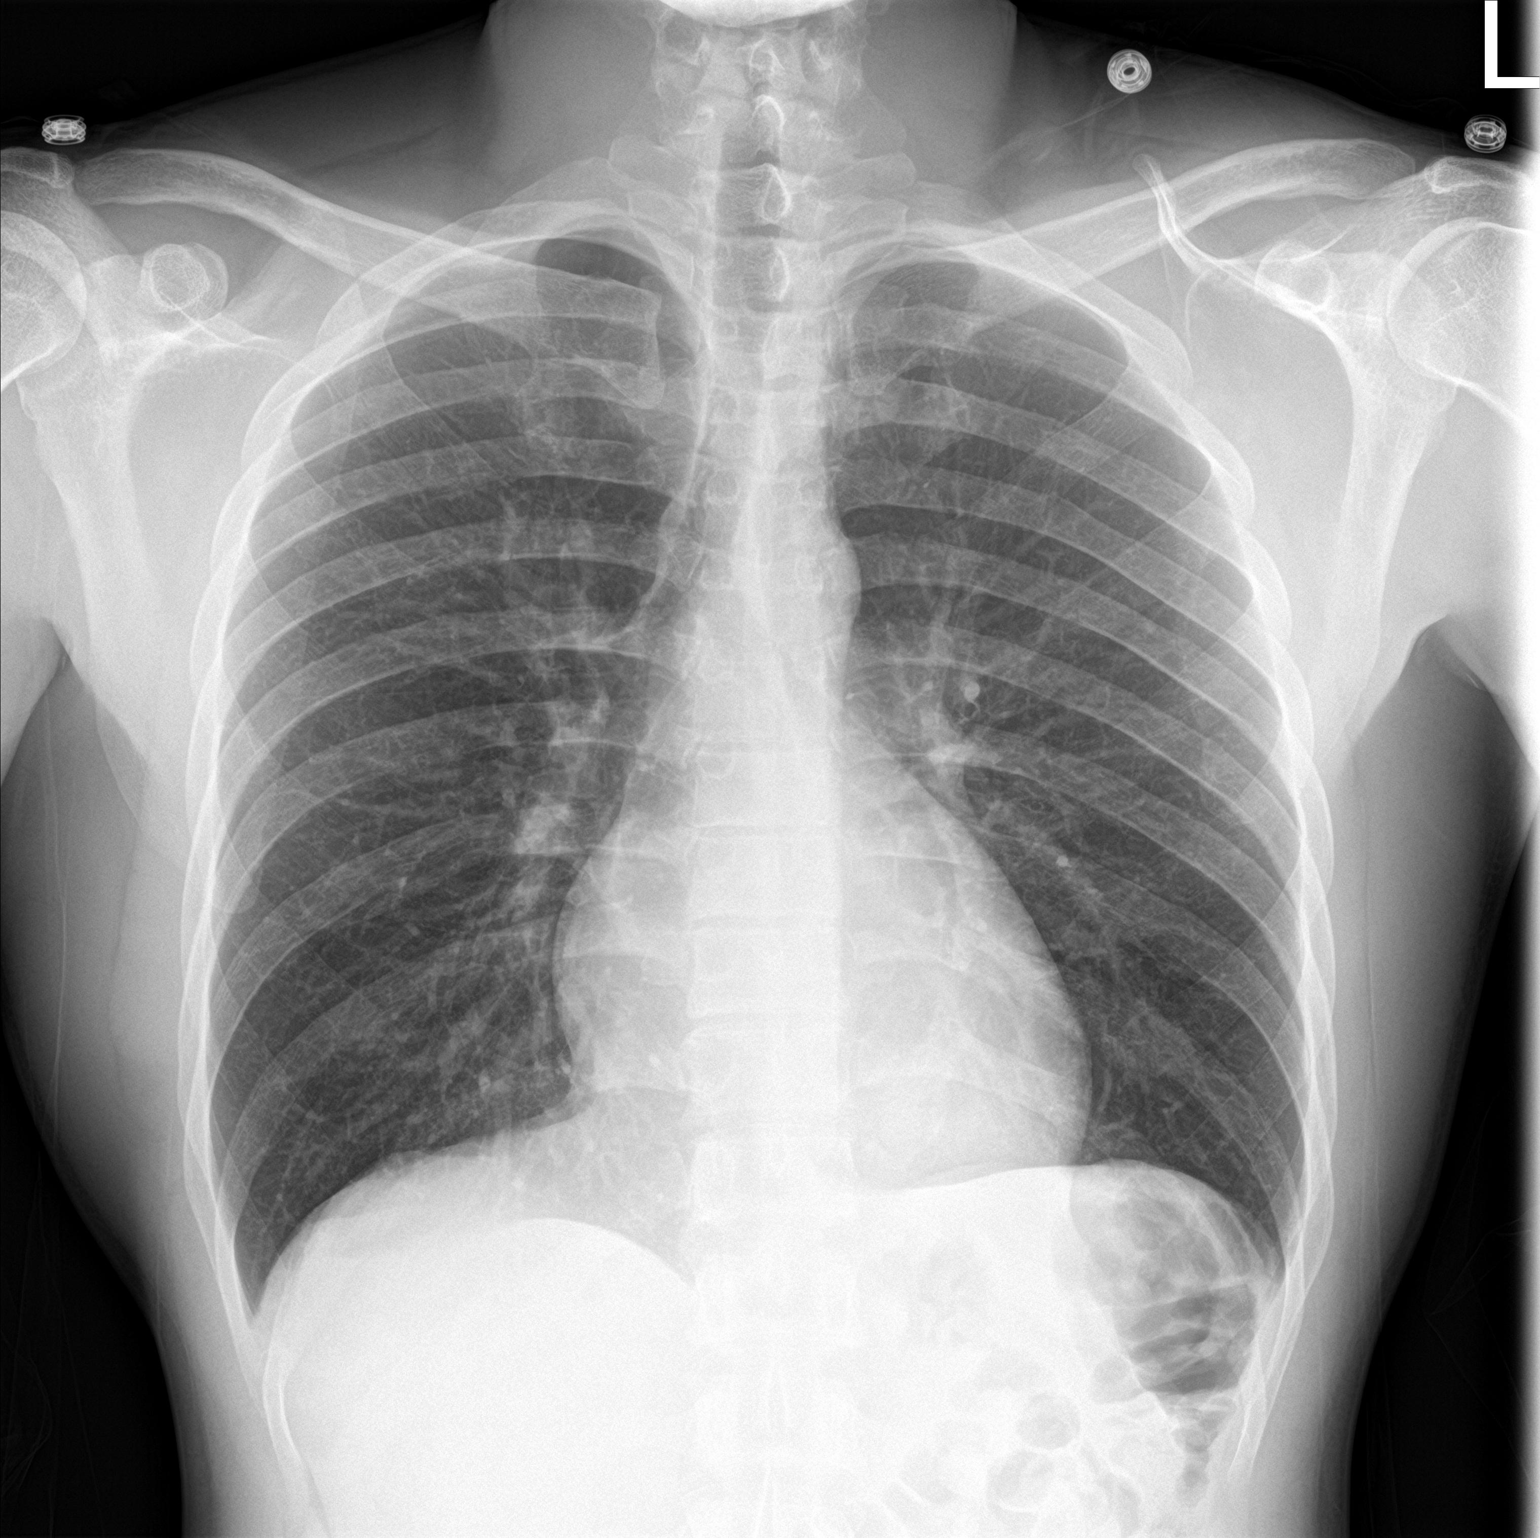

[chest lat]
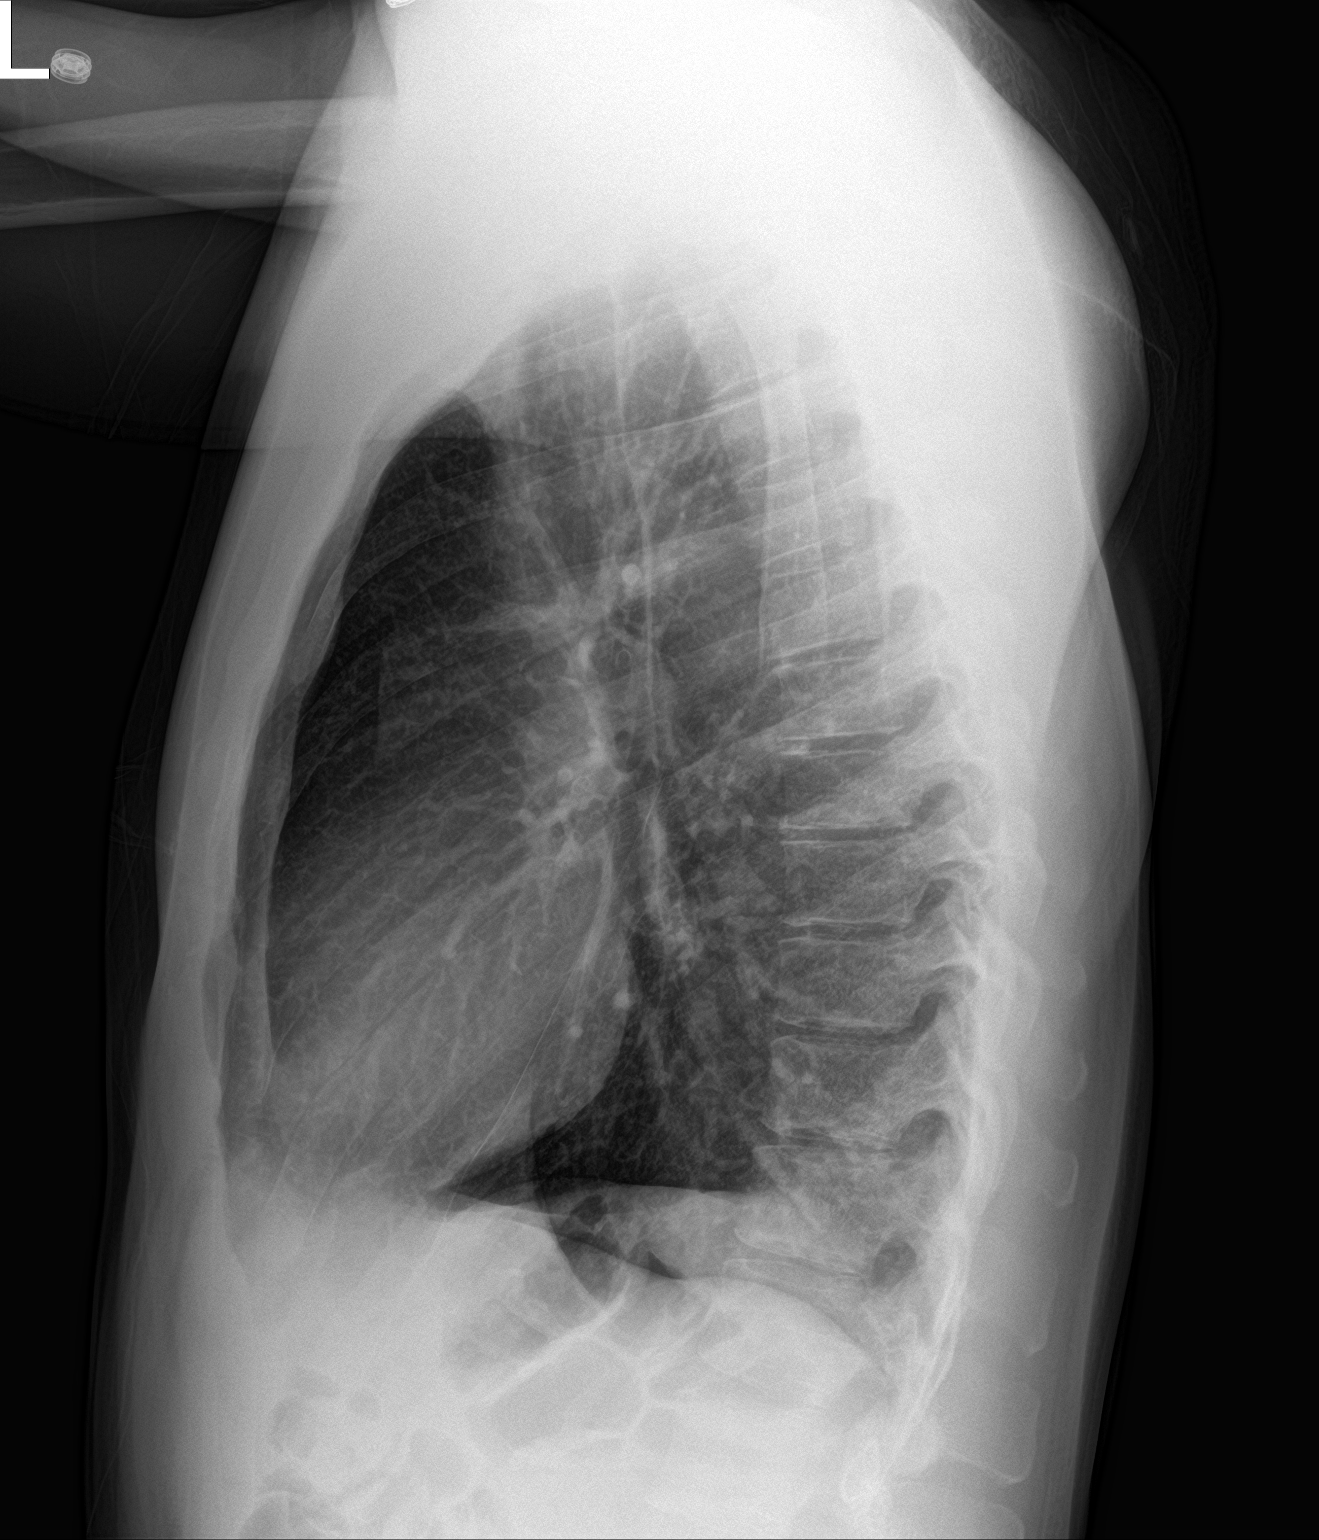

[2 of 2 positions shown; findings below may reference images not displayed]

FINDINGS: Cardiac and mediastinal silhouettes are stable in size and contour,
and remain within normal limits.

Previously identified consolidative opacity involving the
retrocardiac posterior left lower lobe again seen, best visualized
on lateral projection. No other focal airspace disease. No edema or
effusion. No pneumothorax.

No acute osseous abnormality.
IMPRESSION: Consolidative opacity involving the retrocardiac left lower lobe,
suspicious for pneumonia, and corresponding with consolidation seen
on recent CT.
